# Patient Record
Sex: Female | Born: 1960 | Race: Black or African American | Hispanic: No | Marital: Married | Smoking: Never smoker
Health system: Southern US, Community
[De-identification: ages and names within clinical notes are randomized; demographics above are authoritative.]

## PROBLEM LIST (undated history)

## (undated) DIAGNOSIS — J45909 Unspecified asthma, uncomplicated: Secondary | ICD-10-CM

## (undated) DIAGNOSIS — I10 Essential (primary) hypertension: Secondary | ICD-10-CM

## (undated) HISTORY — PX: BACK SURGERY: SHX140

## (undated) HISTORY — PX: ABDOMINAL HYSTERECTOMY: SHX81

---

## 2007-10-07 ENCOUNTER — Inpatient Hospital Stay (HOSPITAL_COMMUNITY): Admission: RE | Admit: 2007-10-07 | Discharge: 2007-10-09 | Payer: Self-pay | Admitting: Neurosurgery

## 2007-11-15 ENCOUNTER — Encounter: Admission: RE | Admit: 2007-11-15 | Discharge: 2007-11-15 | Payer: Self-pay | Admitting: Neurosurgery

## 2007-12-27 ENCOUNTER — Encounter: Admission: RE | Admit: 2007-12-27 | Discharge: 2007-12-27 | Payer: Self-pay | Admitting: Neurosurgery

## 2008-03-15 ENCOUNTER — Encounter: Admission: RE | Admit: 2008-03-15 | Discharge: 2008-03-15 | Payer: Self-pay | Admitting: Neurosurgery

## 2009-12-12 ENCOUNTER — Encounter: Admission: RE | Admit: 2009-12-12 | Discharge: 2009-12-12 | Payer: Self-pay | Admitting: Neurosurgery

## 2010-09-30 NOTE — Op Note (Signed)
NAME:  Vicki Berry, Vicki Berry                ACCOUNT NO.:  [id number]   MEDICAL RECORD NO.:  [id number]          PATIENT TYPE:  INP   LOCATION:  3014                         FACILITY:  [facility]   PHYSICIAN:  Donalee Citrin, M.D.        DATE OF BIRTH:  Jul 21, 1960   DATE OF PROCEDURE:  10/07/2007  DATE OF DISCHARGE:                               OPERATIVE REPORT   PREOPERATIVE DIAGNOSIS:   PROCEDURE:  1. Decompressive laminectomies at L4-L5, L5-S1 with standard interbody      fusion.  2. Posterior lumbar interbody fusion at L4-L5, L5-S1 using a hybrid 12      x 22 PEEK Telamon cage packed with local autograft mixed with      Progenix bone substitute at L4-L5 with a tangent allograft wedge on      the other side and a 10 x 22 mm Telamon PEEK cage at L5-S1 packed      with local autograft with Progenix bone substitute and a tangent 10      x 26 mm on the other side.  3. Pedicle screw fixation L4-S1 using a 6.35 Legacy pedicle screw      system.  4. Posterolateral arthrodesis L4-S1 using local autograft packed with      Progenix bone substitute.  5. Placement of a medium Hemovac drain.   SURGEON:  Donalee Citrin, MD   ASSISTANT:  Reinaldo Meeker, MD   ANESTHESIA:  General endotracheal.   HISTORY OF PRESENT ILLNESS:  The patient is a very pleasant 46-year  female with progressive worsening back and bilateral leg pain worse in  the right radiating down to the top of foot and big toe consistent with  an L5 nerve root pattern.  Her MRI scan showed marked spondylosis with  severe spinal stenosis at L4-L5 and L5-S1 with hyperlordosis, facet  diastasis fluid within the facet complexes, and severe lateral recess  stenosis bilaterally as well as grade 1 spondylolisthesis at L4-L5.  The  patient failed all forms of conservative treatment and was recommended  decompressive stabilization.  Procedure risks and benefits of the  operation were explained to the patient.  She understood and agreed to  proceed  forward.   PROCEDURE IN DETAIL:  The patient was brought to the OR, was induced  under general anesthesia, and positioned prone on Wilson frame.  Back  was prepped and draped in the usual sterile fashion.  Fluoroscopy was  localized at the appropriate level.  After infiltrating 10 mL of  lidocaine with epinephrine, we had a midline incision made and Bovie  electrocautery was used to takedown the subperiosteum.  The dissection  was carried out to the lamina of L4, L5, and S1 bilaterally exposing the  TPs at all those levels.  Intraoperative x-ray confirmed localization of  the appropriate level marking the L4 pedicle and L4 TP.  Then the  spinous processes at L4-L5 were removed in their entirety.  Complete  central decompression was performed.  There was remarked facet  arthropathy and severe compression predominantly on the right at the L5  nerve root due  to the facet complex at L4-L5 immediately overgrowing the  L5 root.  This was teased away and removed in piecemeal fashion.  Complete medial facetectomies were performed at both levels.  Her lamina  and facet joints were noted to be markedly dystrophic and sclerotic with  severe compression of all neural foramen on that side.  So, we had  aggressive foraminotomies done at the L4, L5, and S1 nerve roots  bilaterally with complete medial facetectomies at L4-L5 and L5-S1.  This  decompressed the neural foramen and exposed the disk spaces.  After this  was achieved, attention was taken to the pedicle screw placement.  First  using a high-speed drill on the L4 on the right pedicle, a palatal was  drilled using fluoroscopy for trajectory and then using bony landmarks  both within the canal and external canal.  This pedicle was placed.  It  was cannulated with the awl, probed, tapped with a 5 pounds tap, probed  again, and 6 x 45 screw inserted at L4 on the right.  A 6 x 45 was  inserted at L5 on the right and 6 x 35 at S1.  This procedure  was  repeated on the left side with same sized screws.  All screws had  excellent purchase.  Fluoroscopy confirmed good trajectory in each screw  on the way and all screws were probed from within the probe as well as  within the pedicle as well as within the canal confirming no  mediolateral breach.  After all 6 screws had been placed, attention was  taken to interbody work.  The endplates were scraped and peri-disk  spaces were cleaned out.  A size 10 distractor was distally placed at L4-  L5 and this was felt not to be big enough, so a size 12 distractor was  inserted.  This had good apposition to the endplates and so a size 12  was felt to be appropriate sizing for graft material.  Using size 12  Cohan scissors, the right side of the interspace at L4-L5 was cleaned  out and this also helped reduce the deformity and decompressed the  forward.  A Telamon PEEK cage packed with local autograft and mixed with  Progenix was then placed on the right at L4-L5.  Distractor was removed  in a similar fashion on the left side.  This was cleaned out.  Local  autograft was packed centrally and a tangent allograft was inserted on  the left side at L4-L5.  Fluoroscopy confirmed good position of the  interbody spacers at L5-S1.  In a similar fashion, interbody spaces were  placed again with local autograft in the center with PEEK on the  patient's left side and the tangent on the right.  After all interbody  work had been completed, the wound was copiously irrigated and  meticulous hemostasis was maintained.  The neural foramina were re-  explored and noted to be widely patent.  Aggressive decortication of TPs  and lateral gutters, the local autograft was packed in the lateral  gutters along the TPs from L4-S1.  Then 6-mm rods were of appropriate  size.  There were inserted and tangentially done at S1.  The L5 process  was compressed against S1, the L4 compressed against L5.  Then Gelfoam  was laid on  top of the dura.  Postop fluoroscopy confirmed good position  of the screws, rods, and bone grafts.  Then the wound was closed after  immediate placement of medium  Hemovac drain in layers with interrupted  Vicryl in the muscle, subcutaneous tissue, and fascia, and then a  running 4-0 subcuticular to close the skin.  Benzoin and Steri-Strips  were applied.  The patient went to recovery room in stable condition.  At the end of the case, all instrument and sponge count was correct.           ______________________________  Donalee Citrin, M.D.     GC/MEDQ  D:  10/07/2007  T:  10/08/2007  Job:  [id number]

## 2010-09-30 NOTE — Discharge Summary (Signed)
NAME:  Vicki Berry, Vicki Berry                ACCOUNT NO.:  [id number]   MEDICAL RECORD NO.:  [id number]          PATIENT TYPE:  INP   LOCATION:  3014                         FACILITY:  [facility]   PHYSICIAN:  Donalee Citrin, M.D.        DATE OF BIRTH:  11/13/1960   DATE OF ADMISSION:  10/07/2007  DATE OF DISCHARGE:  10/09/2007                               DISCHARGE SUMMARY   ADMITTING DIAGNOSES:  1. Degenerative disease.  2. Lumbar spinal stenosis L4-L5 and L5-S1.   PROCEDURES:  1. Decompressive laminectomy L4-L5 and L5-S1.  2. Posterior lumbar fusion L4-L5 and L5-S1.   DISCHARGE DIAGNOSES:  1. Degenerative disease.  2. Lumbar spinal stenosis L4-L5 and L5-S1.   HOSPITAL COURSE:  The patient was brought in as an EMA, went to the  operating room, underwent the aforementioned procedure.  Postop, patient  did very well and recovered.  While on the floor, the patient was  completely resolved of leg pain and back stiffness.  She progressed with  physical therapy over the next 24-48 hours and by day 3 in the hospital,  she was stable to be discharged home with scheduled followup in 2 weeks.  At the time of discharge, she was ambulating, voiding spontaneously, and  pain was well controlled on pills.           ______________________________  Donalee Citrin, M.D.     GC/MEDQ  D:  11/02/2007  T:  11/03/2007  Job:  [id number]

## 2011-02-11 LAB — CBC
HCT: 43.7
Hemoglobin: 14.6
MCV: 86.9
Platelets: ADEQUATE
RBC: 5.03
RDW: 13.5

## 2011-02-11 LAB — BASIC METABOLIC PANEL
CO2: 21
Calcium: 9.1
Chloride: 106
GFR calc Af Amer: >60
Glucose, Bld: 92
Potassium: 4.1
Sodium: 137

## 2011-02-11 LAB — TYPE AND SCREEN: Antibody Screen: NEGATIVE

## 2016-10-12 ENCOUNTER — Encounter (HOSPITAL_BASED_OUTPATIENT_CLINIC_OR_DEPARTMENT_OTHER): Payer: Self-pay

## 2016-10-12 ENCOUNTER — Emergency Department (HOSPITAL_BASED_OUTPATIENT_CLINIC_OR_DEPARTMENT_OTHER)
Admission: EM | Admit: 2016-10-12 | Discharge: 2016-10-12 | Disposition: A | Discharge status: Home / Self Care | Payer: BLUE CROSS/BLUE SHIELD | Attending: Emergency Medicine | Admitting: Emergency Medicine

## 2016-10-12 DIAGNOSIS — M7989 Other specified soft tissue disorders: Secondary | ICD-10-CM | POA: Diagnosis present

## 2016-10-12 DIAGNOSIS — R600 Localized edema: Secondary | ICD-10-CM | POA: Diagnosis not present

## 2016-10-12 DIAGNOSIS — M791 Myalgia: Secondary | ICD-10-CM | POA: Insufficient documentation

## 2016-10-12 DIAGNOSIS — R2243 Localized swelling, mass and lump, lower limb, bilateral: Secondary | ICD-10-CM

## 2016-10-12 LAB — CBC WITH DIFFERENTIAL/PLATELET
BASOS ABS: 0 10*3/uL (ref 0.0–0.1)
BASOS PCT: 0 %
EOS ABS: 0.1 10*3/uL (ref 0.0–0.7)
Eosinophils Relative: 1 %
HEMATOCRIT: 39.2 % (ref 36.0–46.0)
Hemoglobin: 13 g/dL (ref 12.0–15.0)
Lymphocytes Relative: 25 %
Lymphs Abs: 3 10*3/uL (ref 0.7–4.0)
MCH: 28.9 pg (ref 26.0–34.0)
MCHC: 33.2 g/dL (ref 30.0–36.0)
MCV: 87.1 fL (ref 78.0–100.0)
MONO ABS: 0.8 10*3/uL (ref 0.1–1.0)
Monocytes Relative: 7 %
NEUTROS ABS: 8.1 10*3/uL — AB (ref 1.7–7.7)
NEUTROS PCT: 67 %
Platelets: 175 10*3/uL (ref 150–400)
RBC: 4.5 MIL/uL (ref 3.87–5.11)
RDW: 13.6 % (ref 11.5–15.5)
WBC: 12.1 10*3/uL — ABNORMAL HIGH (ref 4.0–10.5)

## 2016-10-12 LAB — BASIC METABOLIC PANEL
ANION GAP: 6 (ref 5–15)
BUN: 15 mg/dL (ref 6–20)
CALCIUM: 8.6 mg/dL — AB (ref 8.9–10.3)
CO2: 28 mmol/L (ref 22–32)
CREATININE: 1.04 mg/dL — AB (ref 0.44–1.00)
Chloride: 106 mmol/L (ref 101–111)
Glucose, Bld: 88 mg/dL (ref 65–99)
Potassium: 3.6 mmol/L (ref 3.5–5.1)
Sodium: 140 mmol/L (ref 135–145)

## 2016-10-12 LAB — D-DIMER, QUANTITATIVE (NOT AT ARMC): D DIMER QUANT: 0.44 ug{FEU}/mL (ref 0.00–0.50)

## 2016-10-12 MED ORDER — FUROSEMIDE 10 MG/ML IJ SOLN
20.0000 mg | Freq: Once | INTRAMUSCULAR | Status: AC
  Administered 2016-10-12: 20 mg via INTRAVENOUS
  Filled 2016-10-12: qty 2

## 2016-10-12 NOTE — Discharge Instructions (Signed)
Your creatinine is mildly elevated and needs to be rechecked by your primary care provider

## 2016-10-12 NOTE — ED Triage Notes (Signed)
C/o swelling to both feet/ankles x 4 days-NAD-steady gait

## 2016-10-12 NOTE — ED Provider Notes (Signed)
[facility] Note   CSN: [id number] Arrival date & time: 10/12/16  2049  By signing my name below, I, Linna DarnerRussell Turner, attest that this documentation has been prepared under the direction and in the presence of physician practitioner, Rolan BuccoBelfi, Mirakle Tomlin, MD. Electronically Signed: Linna Darnerussell Turner, Scribe. 10/12/2016. 9:59 PM.  History   Chief Complaint Chief Complaint  Patient presents with  . Edema   The history is provided by the patient. No language interpreter was used.    HPI Comments: Vicki Berry is a 56 y.o. female who presents to the Emergency Department complaining of persistent lower leg swelling beginning three days ago. She states her right lower leg began swelling yesterday. Patient reports her left lower leg is painful and notes her right lower leg is not. She experiences very mild bilateral lower leg swelling intermittently but notes her current leg swelling is the most severe it has ever been. No alleviating factors noted. Patient [place] and the return trip was yesterday. No h/o DVT or PE. No h/o cardiac problems. She has been urinating normally. She denies fevers, chills, chest pain, dyspnea, or any other associated symptoms.  History reviewed. No pertinent past medical history.  There are no active problems to display for this patient.   Past Surgical History:  Procedure Laterality Date  . ABDOMINAL HYSTERECTOMY    . BACK SURGERY      OB History    No data available       Home Medications    Prior to Admission medications   Medication Sig Start Date End Date Taking? Authorizing Provider  naproxen sodium (ANAPROX) 220 MG tablet Take 220 mg by mouth 2 (two) times daily with a meal.   Yes [provider]    Family History No family history on file.  Social History Social History  Substance Use Topics  . Smoking status: Never Smoker  . Smokeless tobacco: Never Used  . Alcohol use  No     Allergies   Biaxin [clarithromycin]   Review of Systems Review of Systems  Constitutional: Negative for chills, diaphoresis, fatigue and fever.  HENT: Negative for congestion, rhinorrhea and sneezing.   Eyes: Negative.   Respiratory: Negative for cough, chest tightness and shortness of breath.   Cardiovascular: Positive for leg swelling. Negative for chest pain.  Gastrointestinal: Negative for abdominal pain, blood in stool, diarrhea, nausea and vomiting.  Genitourinary: Negative for difficulty urinating, dysuria, flank pain, frequency and hematuria.  Musculoskeletal: Positive for myalgias. Negative for arthralgias and back pain.  Skin: Negative for rash.  Neurological: Negative for dizziness, speech difficulty, weakness, numbness and headaches.   Physical Exam Updated Vital Signs BP 129/75   Pulse 65   Temp 98.4 F (36.9 C) (Oral)   Resp (!) 22   Ht 5\' 6"  (1.676 m)   Wt 104.9 kg (231 lb 4 oz)   SpO2 97%   BMI 37.32 kg/m   Physical Exam  Constitutional: She is oriented to person, place, and time. She appears well-developed and well-nourished.  HENT:  Head: Normocephalic and atraumatic.  Eyes: Pupils are equal, round, and reactive to light.  Neck: Normal range of motion. Neck supple.  Cardiovascular: Normal rate, regular rhythm and normal heart sounds.   Pulses:      Dorsalis pedis pulses are 2+ on the right side, and 2+ on the left side.  Pulmonary/Chest: Effort normal and breath sounds normal. No respiratory distress. She has  no wheezes. She has no rales. She exhibits no tenderness.  Abdominal: Soft. Bowel sounds are normal. There is no tenderness. There is no rebound and no guarding.  Musculoskeletal: Normal range of motion. She exhibits edema.  1+ pitting edema to the right lower extremity and 2+ pitting edema to the left lower extremity. No warmth or erythema.  Lymphadenopathy:    She has no cervical adenopathy.  Neurological: She is alert and oriented to  person, place, and time.  Skin: Skin is warm and dry. No rash noted.  Psychiatric: She has a normal mood and affect.   ED Treatments / Results  Labs (all labs ordered are listed, but only abnormal results are displayed) Labs Reviewed  BASIC METABOLIC PANEL - Abnormal; Notable for the following:       Result Value   Creatinine, Ser 1.04 (*)    Calcium 8.6 (*)    All other components within normal limits  CBC WITH DIFFERENTIAL/PLATELET - Abnormal; Notable for the following:    WBC 12.1 (*)    Neutro Abs 8.1 (*)    All other components within normal limits  D-DIMER, QUANTITATIVE (NOT AT [facility])    EKG  EKG Interpretation  Date/Time:  Monday Oct 12 2016 21:52:19 EDT Ventricular Rate:  69 PR Interval:    QRS Duration: 97 QT Interval:  410 QTC Calculation: 440 R Axis:   75 Text Interpretation:  Sinus rhythm since last tracing no significant change Confirmed by Rolan Bucco [phone]) on 10/12/2016 11:12:10 PM       Radiology No results found.  Procedures Procedures (including critical care time)  DIAGNOSTIC STUDIES: Oxygen Saturation is 100% on RA, normal by my interpretation.    COORDINATION OF CARE: 9:48 PM Discussed treatment plan with pt at bedside and pt agreed to plan.  Medications Ordered in ED Medications  furosemide (LASIX) injection 20 mg (20 mg Intravenous Given 10/12/16 2234)     Initial Impression / Assessment and Plan / ED Course  I have reviewed the triage vital signs and the nursing notes.  Pertinent labs & imaging results that were available during my care of the patient were reviewed by me and considered in my medical decision making (see chart for details).     Patient presents with bilateral lower leg edema, left greater than right. She has no apparent lung involvement. No cardiac symptoms. Her labs are non-concerning other than her creatinine is mildly elevated. There are no signs of cellulitis. Her d-dimer is negative I will bring her back  tomorrow to do Doppler ultrasounds to rule out DVT. She was given dose of Lasix in the ED. She was encouraged to keep her legs elevated. She was encouraged to watch her salt intake. She was encouraged to have close follow-up with her PCP. I did advise her that she'll need to have her creatinine rechecked by her PCP. Return precautions were given.  Final Clinical Impressions(s) / ED Diagnoses   Final diagnoses:  Pedal edema    New Prescriptions New Prescriptions   No medications on file   I personally performed the services described in this documentation, which was scribed in my presence.  The recorded information has been reviewed and considered.    Rolan Bucco, MD 10/12/16 [phone]

## 2016-10-12 NOTE — ED Notes (Signed)
Radiology at bedside to schedule US.

## 2016-10-13 ENCOUNTER — Ambulatory Visit (HOSPITAL_BASED_OUTPATIENT_CLINIC_OR_DEPARTMENT_OTHER)
Admit: 2016-10-13 | Discharge: 2016-10-13 | Disposition: A | Discharge status: Home / Self Care | Payer: BLUE CROSS/BLUE SHIELD | Attending: Emergency Medicine | Admitting: Emergency Medicine

## 2016-10-13 DIAGNOSIS — R2243 Localized swelling, mass and lump, lower limb, bilateral: Secondary | ICD-10-CM | POA: Insufficient documentation

## 2016-12-22 ENCOUNTER — Other Ambulatory Visit (HOSPITAL_BASED_OUTPATIENT_CLINIC_OR_DEPARTMENT_OTHER): Payer: Self-pay | Admitting: Neurosurgery

## 2016-12-22 DIAGNOSIS — M48062 Spinal stenosis, lumbar region with neurogenic claudication: Secondary | ICD-10-CM

## 2016-12-26 ENCOUNTER — Ambulatory Visit (HOSPITAL_BASED_OUTPATIENT_CLINIC_OR_DEPARTMENT_OTHER)
Admission: RE | Admit: 2016-12-26 | Discharge: 2016-12-26 | Disposition: A | Discharge status: Home / Self Care | Payer: BLUE CROSS/BLUE SHIELD | Source: Ambulatory Visit | Attending: Neurosurgery | Admitting: Neurosurgery

## 2016-12-26 DIAGNOSIS — M48062 Spinal stenosis, lumbar region with neurogenic claudication: Secondary | ICD-10-CM | POA: Insufficient documentation

## 2016-12-26 DIAGNOSIS — M5126 Other intervertebral disc displacement, lumbar region: Secondary | ICD-10-CM | POA: Diagnosis not present

## 2016-12-26 MED ORDER — GADOBENATE DIMEGLUMINE 529 MG/ML IV SOLN
20.0000 mL | Freq: Once | INTRAVENOUS | Status: AC | PRN
  Administered 2016-12-26: 20 mL via INTRAVENOUS

## 2019-01-10 IMAGING — MR MR LUMBAR SPINE WO/W CM
10 of 11 series · 43 of 48 positions shown · IV contrast (20 ML MULTIHANCE)
Comparison: MRI of the lumbar spine 08/31/2009 at [REDACTED].

CLINICAL DATA: Lumbar stenosis neurogenic location. Low back pain
for 4 months. Left leg pain. Numbness in left lower extremity.
Previous lumbar spine fusion 4447.

EXAM:
MRI LUMBAR SPINE WITHOUT AND WITH CONTRAST
TECHNIQUE: Multiplanar and multiecho pulse sequences of the lumbar spine were
obtained without and with intravenous contrast.
CONTRAST:  20mL MULTIHANCE GADOBENATE DIMEGLUMINE 529 MG/ML IV SOLN

[Series 3: T1 · sagittal · 4.0mm · 0.81mm/px · 3 of 16 slices shown (1 of 6)]
[im 1/16]
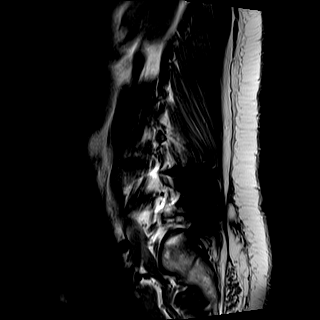
[im 8/16]
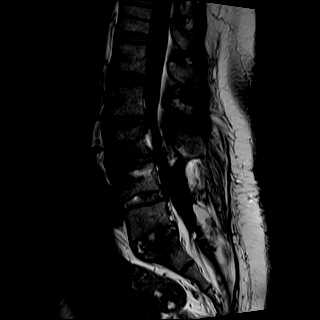
[im 16/16]
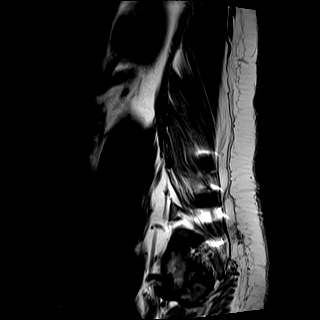

[Series 5: T2 · axial · 4.0mm · 0.62mm/px · z∈[+40,+135]mm · 5 of 20 slices shown (1 of 3)]
[im 1/20]
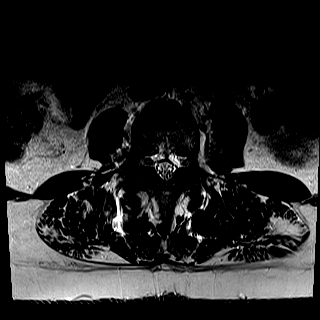
[im 5/20]
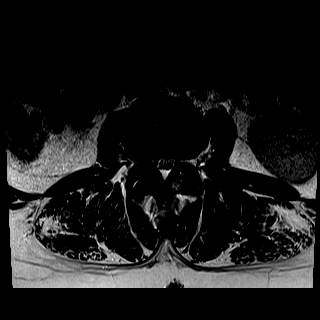
[im 10/20]
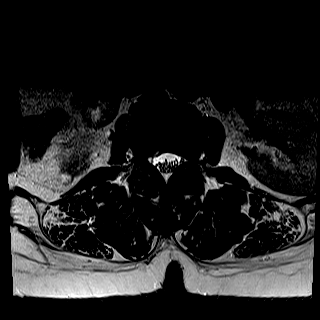
[im 15/20]
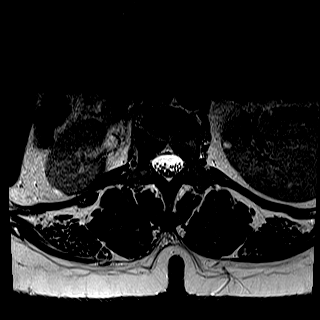
[im 20/20]
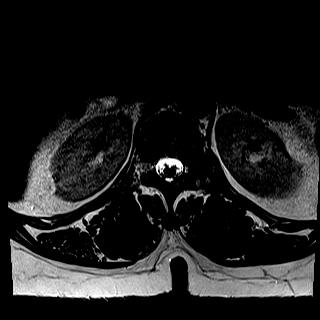

[Series 6: T2 · axial · 4.0mm · 0.62mm/px · z∈[-84,+17]mm · 5 of 22 slices shown (2 of 3)]
[im 1/22]
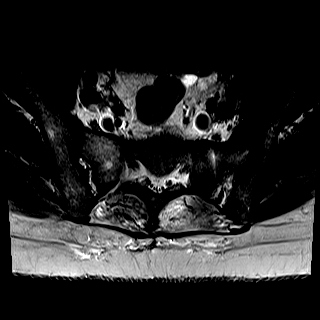
[im 6/22]
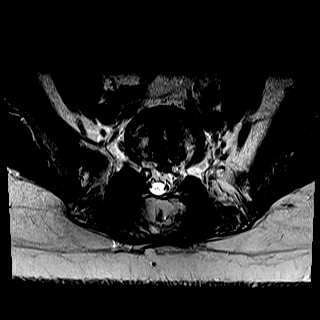
[im 11/22]
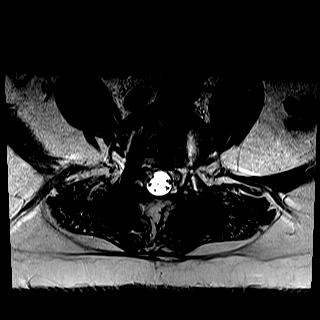
[im 16/22]
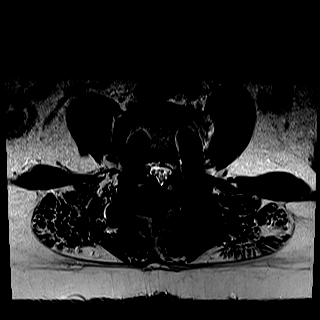
[im 22/22]
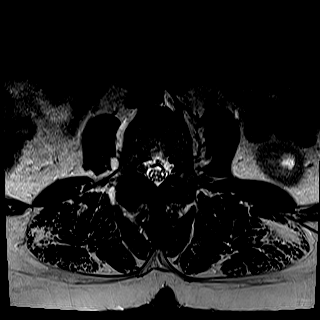

[Series 7: T1 · axial · 4.0mm · 0.62mm/px · z∈[+40,+135]mm · 5 of 20 slices shown (2 of 6)]
[im 1/20]
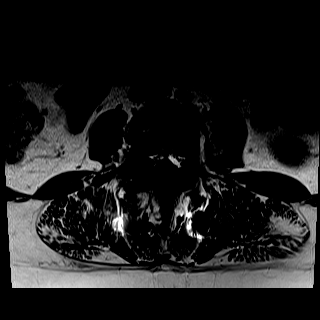
[im 5/20]
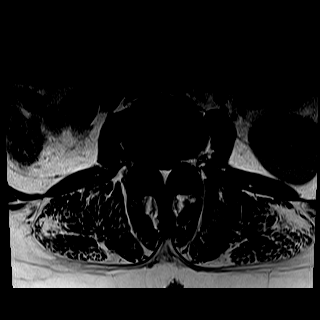
[im 10/20]
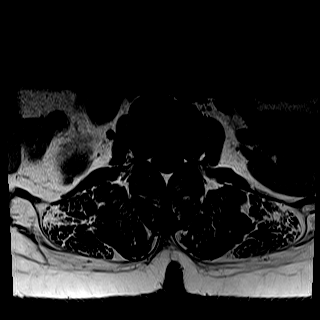
[im 15/20]
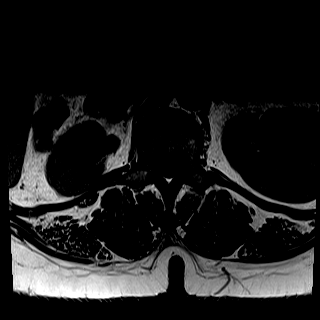
[im 20/20]
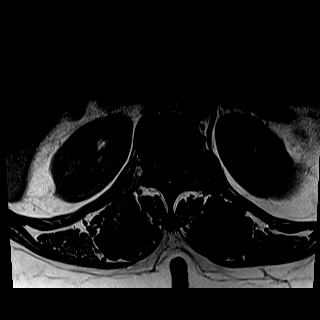

[Series 8: T1 · axial · 4.0mm · 0.62mm/px · z∈[-84,+17]mm · 5 of 22 slices shown (3 of 6)]
[im 1/22]
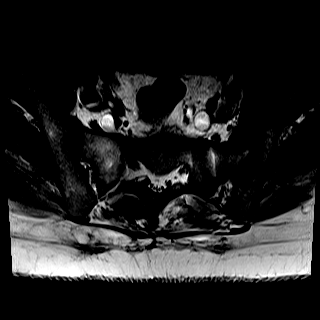
[im 6/22]
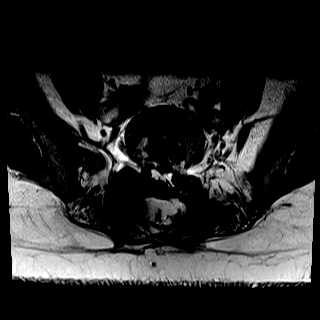
[im 11/22]
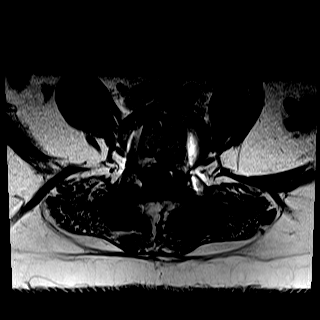
[im 16/22]
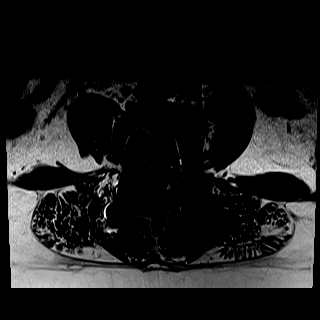
[im 22/22]
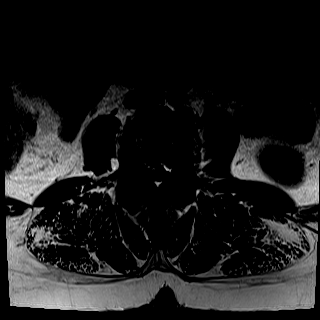

[Series 9: T2 · sagittal · 4.0mm · 0.81mm/px · 4 of 16 slices shown (3 of 3)]
[im 1/16]
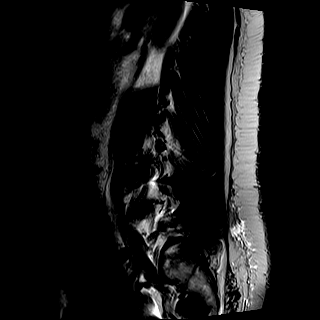
[im 6/16]
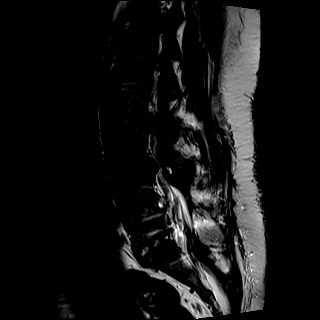
[im 11/16]
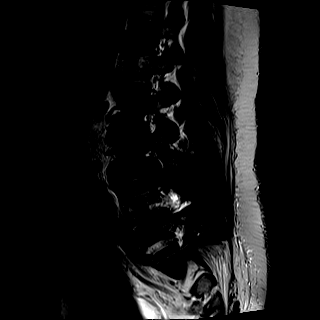
[im 16/16]
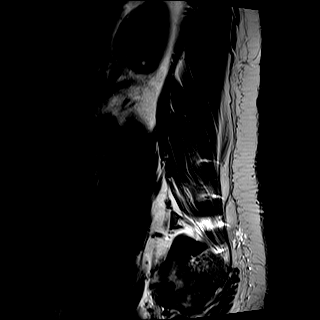

[Series 10: T1 fat-sat · sagittal · 4.0mm · 0.81mm/px · 4 of 16 slices shown]
[im 1/16]
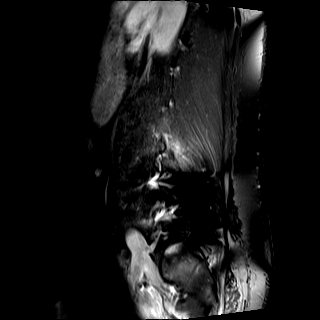
[im 6/16]
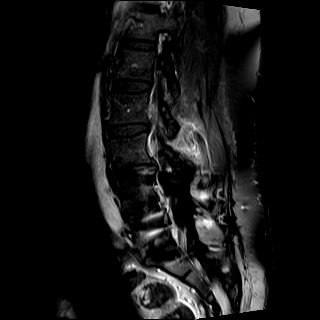
[im 11/16]
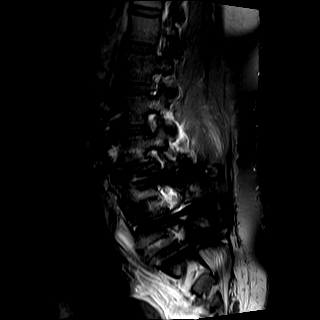
[im 16/16]
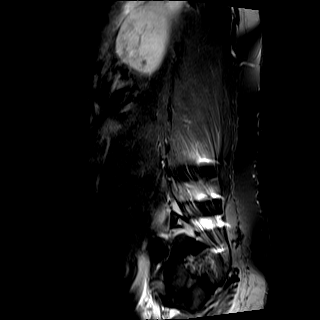

[Series 11: T1 · axial · 4.0mm · 0.62mm/px · z∈[+35,+129]mm · 5 of 20 slices shown (4 of 6)]
[im 1/20]
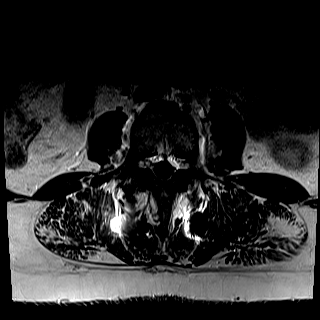
[im 5/20]
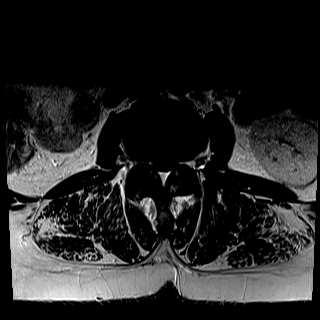
[im 10/20]
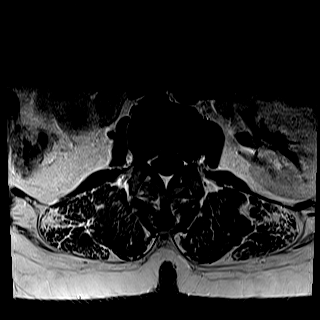
[im 15/20]
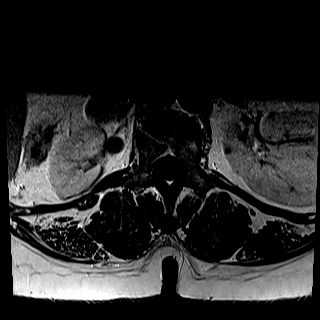
[im 20/20]
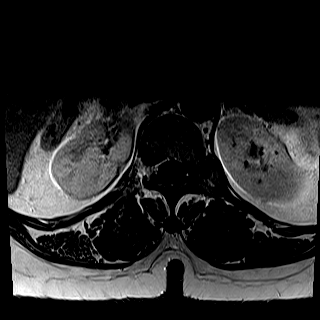

[Series 12: T1 · axial · 4.0mm · 0.62mm/px · z∈[-87,+14]mm · 5 of 22 slices shown (5 of 6)]
[im 1/22]
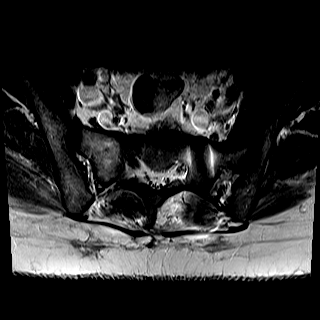
[im 6/22]
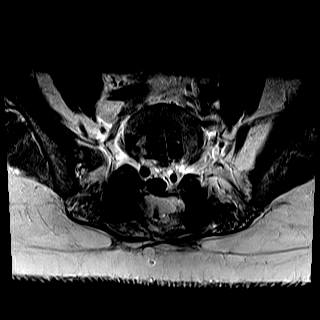
[im 11/22]
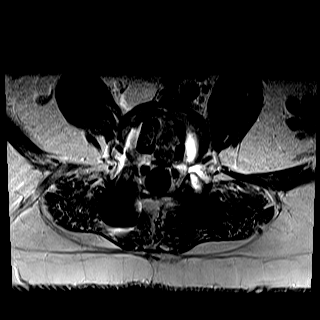
[im 16/22]
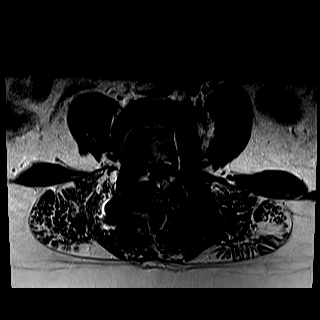
[im 22/22]
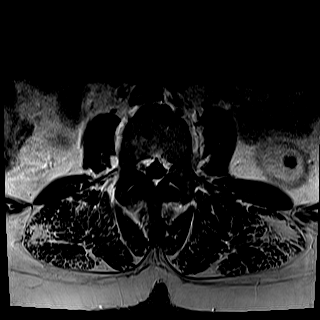

[Series 13: T1 · sagittal · 4.0mm · 0.81mm/px · 2 of 16 slices shown (6 of 6)]
[im 1/16]
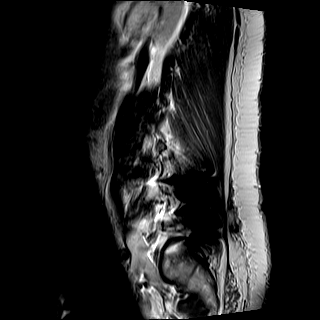
[im 6/16]
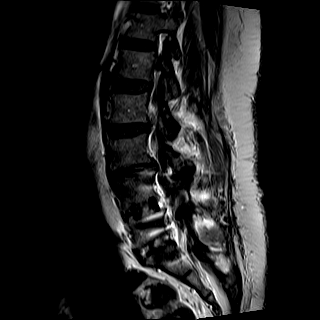

[43 of 48 positions shown; findings below may reference images not displayed]

FINDINGS: Segmentation: 5 non rib-bearing lumbar type vertebral bodies are
present.

Alignment: Slight anterolisthesis is present at L3-4, new since the
prior exam. AP alignment is otherwise anatomic.

Vertebrae: Progressive endplate marrow changes are present at L3-4.
Marrow signal and vertebral body heights are otherwise normal.

Conus medullaris: Extends to the T12-L1 level and appears normal.

Paraspinal and other soft tissues: A 7 mm cyst is noted at the lower
pole of the right kidney. A 9 mm cyst is present at the lower pole
of the left kidney.

Disc levels:

The disc levels at T12-L1 and L1-2 are normal.

L2-3: A right paramedian superior disc protrusion or what appears to
be a free fragment extends within the right lateral recess 1.8 cm
above the inferior endplate of L2. This results in severe right
lateral recess and subarticular stenosis. Disc material extends into
the right neural foramen as well. Moderate right and mild left
foraminal narrowing is present. Asymmetric right-sided facet
hypertrophy is present.

L3-4: There is uncovering of a broad-based disc protrusion with
significant progression of now moderate central canal stenosis with
subarticular narrowing bilaterally. Advanced bilateral facet
hypertrophy has progressed. Moderate foraminal narrowing is evident
bilaterally.

L4-5: Fusion and laminectomy is present. There is no residual
recurrent stenosis.

L5-S1: Fusion and laminectomy is noted. There is no residual
recurrent stenosis.
IMPRESSION: 1. Progressive adjacent level disease at L3-4 with moderate central
and bilateral foraminal stenosis. There is progressive disc
protrusion and advanced facet hypertrophy.
2. Superior disc extrusion or free fragment extending from the L2-3
disc space superiorly resulting in severe right lateral recess and
subarticular stenosis.
3. Moderate right and mild left foraminal stenosis at L2-3 has
progressed.
4. PLIF L4-5 and L5-S1 without residual recurrent stenosis at these
levels.

## 2020-10-17 ENCOUNTER — Other Ambulatory Visit: Payer: Self-pay

## 2020-10-17 ENCOUNTER — Emergency Department (HOSPITAL_BASED_OUTPATIENT_CLINIC_OR_DEPARTMENT_OTHER)
Admission: EM | Admit: 2020-10-17 | Discharge: 2020-10-17 | Disposition: A | Discharge status: Home / Self Care | Payer: BC Managed Care – PPO | Attending: Emergency Medicine | Admitting: Emergency Medicine

## 2020-10-17 ENCOUNTER — Encounter (HOSPITAL_BASED_OUTPATIENT_CLINIC_OR_DEPARTMENT_OTHER): Payer: Self-pay

## 2020-10-17 DIAGNOSIS — J45909 Unspecified asthma, uncomplicated: Secondary | ICD-10-CM | POA: Diagnosis not present

## 2020-10-17 DIAGNOSIS — I10 Essential (primary) hypertension: Secondary | ICD-10-CM | POA: Insufficient documentation

## 2020-10-17 DIAGNOSIS — J019 Acute sinusitis, unspecified: Secondary | ICD-10-CM | POA: Insufficient documentation

## 2020-10-17 DIAGNOSIS — R0981 Nasal congestion: Secondary | ICD-10-CM | POA: Diagnosis present

## 2020-10-17 HISTORY — DX: Essential (primary) hypertension: I10

## 2020-10-17 HISTORY — DX: Unspecified asthma, uncomplicated: J45.909

## 2020-10-17 MED ORDER — NETI POT SINUS WASH 2300-700 MG NA KIT: PACK | NASAL | 0 refills | Status: AC

## 2020-10-17 NOTE — Discharge Instructions (Addendum)
Vicki Berry, it was a pleasure taking care of you. I believe you have sinusitis, which could be due to allergens or to an infection. At this time, I think that a bacterial infection is unlikely.  Here are your instructions: START Neti pot for sinus washes, twice daily CONTINUE your Flonase, Allegra, and lozenges

## 2020-10-17 NOTE — ED Provider Notes (Signed)
[facility] Provider Note   CSN: [id number] Arrival date & time: 10/17/20  1109     History Chief Complaint  Patient presents with  . Sore Throat    Runny nose, headache    Vicki Berry is a 60 y.o. female with hx of HTN, asthma, allergic rhinitis who presents with complaint of congestion, sinus pressure, headache, sore throat for the past 2 days. States this happened after cleaning her attic and kicking up a lot of dust. Has continued cleaning since then, and also notes lots of dust exposure at work. 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 Nobody around her has similar symptoms. She has been using flonase, allegra, and albuterol inhaler. Denies fever, chills, cough, shortness of breath  Past Medical History:  Diagnosis Date  . Asthma   . Hypertension     There are no problems to display for this patient.   Past Surgical History:  Procedure Laterality Date  . ABDOMINAL HYSTERECTOMY    . BACK SURGERY       OB History   No obstetric history on file.     History reviewed. No pertinent family history.  Social History   Tobacco Use  . Smoking status: Never Smoker  . Smokeless tobacco: Never Used  Substance Use Topics  . Alcohol use: No  . Drug use: No    Home Medications Prior to Admission medications   Medication Sig Start Date End Date Taking? Authorizing Provider  albuterol (VENTOLIN HFA) 108 (90 Base) MCG/ACT inhaler Inhale into the lungs. 09/17/20  Yes [provider]  fexofenadine (ALLEGRA) 180 MG tablet Take by mouth. 09/17/20  Yes [provider]  Sodium Chloride-Sodium Bicarb (NETI POT SINUS [place]) 2300-700 MG KIT Flush sinuses twice daily 10/17/20  Yes Andrew Au, MD  naproxen sodium (ANAPROX) 220 MG tablet Take 220 mg by mouth 2 (two) times daily with a meal.    [provider]    Allergies    Biaxin [clarithromycin] and Metronidazole  Review of Systems   Review of Systems   Constitutional: Negative for chills and fever.  HENT: Positive for congestion, rhinorrhea, sinus pressure, sore throat and voice change. Negative for ear pain and hearing loss.   Respiratory: Negative for cough and shortness of breath.   Cardiovascular: Negative for chest pain and palpitations.  Gastrointestinal: Negative for abdominal pain, nausea and vomiting.  Genitourinary: Negative for dysuria and frequency.  All other systems reviewed and are negative.   Physical Exam Updated Vital Signs BP 133/75 (BP Location: Right Arm)   Pulse 74   Temp 98.3 F (36.8 C) (Oral)   Resp 18   Ht _0  (1.676 m)   Wt 95.7 kg   SpO2 99%   BMI 34.06 kg/m   Physical Exam Vitals and nursing note reviewed.  Constitutional:      Appearance: Normal appearance.  HENT:     Head: Normocephalic and atraumatic.     Right Ear: Tympanic membrane, ear canal and external ear normal.     Left Ear: Tympanic membrane, ear canal and external ear normal.     Nose: Nose normal. No congestion or rhinorrhea.     Mouth/Throat:     Mouth: Mucous membranes are moist.     Pharynx: Uvula midline. No oropharyngeal exudate, posterior oropharyngeal erythema or uvula swelling.     Tonsils: No tonsillar exudate.  Eyes:     Extraocular Movements: Extraocular movements intact.  Cardiovascular:  Rate and Rhythm: Normal rate and regular rhythm.     Heart sounds: Normal heart sounds. No murmur heard. No friction rub. No gallop.   Pulmonary:     Effort: Pulmonary effort is normal.     Breath sounds: No wheezing, rhonchi or rales.  Abdominal:     General: Abdomen is flat.  Musculoskeletal:        General: No swelling or deformity. Normal range of motion.     Cervical back: Normal range of motion and neck supple.  Skin:    General: Skin is warm and dry.  Neurological:     General: No focal deficit present.     Mental Status: She is alert and oriented to person, place, and time.  Psychiatric:        Mood and  Affect: Mood normal.        Behavior: Behavior normal.     ED Results / Procedures / Treatments   Labs (all labs ordered are listed, but only abnormal results are displayed) Labs Reviewed - No data to display  EKG None  Radiology No results found.  Procedures Procedures   Medications Ordered in ED Medications - No data to display  ED Course  I have reviewed the triage vital signs and the nursing notes.  Pertinent labs & imaging results that were available during my care of the patient were reviewed by me and considered in my medical decision making (see chart for details).    MDM Rules/Calculators/A&P                          Patient with sinus pressure, congestion, sore throat after dust exposure while cleaning her attic. She likely has sinusitis could be from allergen exposure or from viral illness. Low suspicion for bacterial infection with no fever, tachycardia, oropharyngeal exudates, and she generally appears well.   Will discharge with Neti pot for sinus flushes. She will continue using flonase, allegra, throat lozenges.  Final Clinical Impression(s) / ED Diagnoses Final diagnoses:  Acute non-recurrent sinusitis, unspecified location    Rx / DC Orders ED Discharge Orders         Ordered    Sodium Chloride-Sodium Bicarb (NETI POT SINUS [place]) 2300-700 MG KIT        10/17/20 1214           Andrew Au, MD 10/17/20 1239    Rex Kras, Wenda Overland, MD 10/19/20 [phone]

## 2020-10-17 NOTE — ED Triage Notes (Signed)
Pt states sorethroat, headche, runny nose, beginning Monday, has not been covid tested. Was cleaning attic, kicked up some dust.

## 2023-11-27 ENCOUNTER — Encounter (HOSPITAL_BASED_OUTPATIENT_CLINIC_OR_DEPARTMENT_OTHER): Payer: Self-pay

## 2023-11-27 ENCOUNTER — Other Ambulatory Visit: Payer: Self-pay

## 2023-11-27 ENCOUNTER — Emergency Department (HOSPITAL_BASED_OUTPATIENT_CLINIC_OR_DEPARTMENT_OTHER)
Admission: EM | Admit: 2023-11-27 | Discharge: 2023-11-27 | Disposition: A | Discharge status: Home / Self Care | Attending: Emergency Medicine | Admitting: Emergency Medicine

## 2023-11-27 DIAGNOSIS — H5789 Other specified disorders of eye and adnexa: Secondary | ICD-10-CM | POA: Insufficient documentation

## 2023-11-27 DIAGNOSIS — H5712 Ocular pain, left eye: Secondary | ICD-10-CM | POA: Insufficient documentation

## 2023-11-27 MED ORDER — FLUORESCEIN SODIUM 1 MG OP STRP
1.0000 | ORAL_STRIP | Freq: Once | OPHTHALMIC | Status: AC
  Administered 2023-11-27: 1 via OPHTHALMIC
  Filled 2023-11-27: qty 1

## 2023-11-27 MED ORDER — TETRACAINE HCL 0.5 % OP SOLN
2.0000 [drp] | Freq: Once | OPHTHALMIC | Status: AC
  Administered 2023-11-27: 2 [drp] via OPHTHALMIC
  Filled 2023-11-27: qty 4

## 2023-11-27 MED ORDER — POLYMYXIN B-TRIMETHOPRIM 10000-0.1 UNIT/ML-% OP SOLN
2.0000 [drp] | Freq: Three times a day (TID) | OPHTHALMIC | Status: DC
  Administered 2023-11-27: 2 [drp] via OPHTHALMIC
  Filled 2023-11-27: qty 10

## 2023-11-27 NOTE — ED Notes (Signed)
ED Provider at bedside for eye exam 

## 2023-11-27 NOTE — ED Provider Notes (Signed)
 [place] [facility] AT [facility] Provider Note   CSN: [id number] Arrival date & time: 11/27/23  9052     Patient presents with: Eye Pain   Vicki Berry is a 63 y.o. female.   Patient to the emergency department for evaluation of left eye redness, drainage, and some pain in the morning ongoing over the past 4 days.  She noticed some matting when she awoke initially.  She had some pain with light when she first open her eyes in the morning, but no constant pain.  She has had some cloudiness of her vision like a film is over my eye.  She does wear contact lenses.  She does not always take them out at night.  She denies pain with movement of her eye on my exam.  No fevers.  No significant swelling or pain around the eye.       Prior to Admission medications   Medication Sig Start Date End Date Taking? Authorizing Provider  albuterol (VENTOLIN HFA) 108 (90 Base) MCG/ACT inhaler Inhale into the lungs. 09/17/20   [provider]  fexofenadine (ALLEGRA) 180 MG tablet Take by mouth. 09/17/20   [provider]  naproxen sodium (ANAPROX) 220 MG tablet Take 220 mg by mouth 2 (two) times daily with a meal.    [provider]  Sodium Chloride-Sodium Bicarb (NETI POT SINUS [place]) 2300-700 MG KIT Flush sinuses twice daily 10/17/20   Laurence Fonda GRADE, MD    Allergies: Biaxin [clarithromycin], Dust mite extract, and Metronidazole    Review of Systems  Updated Vital Signs BP (!) 176/93 (BP Location: Left Arm)   Pulse 70   Temp 98.4 F (36.9 C) (Oral)   Resp 18   Ht 5' 6 (1.676 m)   Wt 97.1 kg   SpO2 98%   BMI 34.54 kg/m   Physical Exam Vitals and nursing note reviewed.  Constitutional:      Appearance: She is well-developed.  HENT:     Head: Normocephalic and atraumatic.  Eyes:     General: Lids are normal.        Right eye: No foreign body, discharge or hordeolum.        Left eye: No discharge or hordeolum.     Intraocular pressure:  Left eye pressure is 17 mmHg.     Conjunctiva/sclera:     Right eye: Right conjunctiva is not injected. No chemosis or exudate.    Left eye: Left conjunctiva is injected. No chemosis or exudate.    Pupils:     Right eye: Pupil is round, reactive and not sluggish.     Left eye: Pupil is round, reactive and not sluggish. Fluorescein  uptake present.      Comments: With visible light, there is a small hazy grayish area in the center of the cornea.  On fluorescein  exam there is a small punctate area of uptake at approximately 2:00, a couple of millimeters off of the visual axis.  Sclera injection noted.  Ciliary flush noted.  Pulmonary:     Effort: No respiratory distress.  Musculoskeletal:     Cervical back: Normal range of motion and neck supple.  Skin:    General: Skin is warm and dry.  Neurological:     Mental Status: She is alert.     (all labs ordered are listed, but only abnormal results are displayed) Labs Reviewed - No data to display  EKG: None  Radiology: No results found.   Procedures  Medications Ordered in the ED  trimethoprim -polymyxin b  (POLYTRIM ) ophthalmic solution 2 drop (2 drops Left Eye Given 11/27/23 1056)  fluorescein  ophthalmic strip 1 strip (1 strip Left Eye Given by Other 11/27/23 1056)  tetracaine  (PONTOCAINE) 0.5 % ophthalmic solution 2 drop (2 drops Left Eye Given by Other 11/27/23 1056)   ED Course  Patient seen and examined. History obtained directly from patient.   Labs/EKG: None ordered  Imaging: None ordered  Medications/Fluids: Ordered: Tetracaine , fluorescein , Polytrim   Most recent vital signs reviewed and are as follows: BP (!) 176/93 (BP Location: Left Arm)   Pulse 70   Temp 98.4 F (36.9 C) (Oral)   Resp 18   Ht 5' 6 (1.676 m)   Wt 97.1 kg   SpO2 98%   BMI 34.54 kg/m   Initial impression: Left eye pain and redness, and patient who wears contact lenses.  Slit-lamp exam showed small punctate area of uptake.  There is a small  hazy area in the cornea noted that is visible.  Unclear if this is a corneal infiltrate versus early ulcer.  I discussed case with on-call ophthalmologist Dr. Octavia.  He advises Polytrim  drops 3 times a day.  These will be provided to the patient here.  He asked that the patient call the office on Monday morning to schedule a follow-up appointment on Monday.  Advises no contact lens use.  I have passed these recommendations onto the patient.  Discussed importance of follow-up.  I have provided patient information to consultant.   11:04 AM Reassessment performed. Patient appears stable.  Most current vital signs reviewed and are as follows: BP (!) 176/93 (BP Location: Left Arm)   Pulse 70   Temp 98.4 F (36.9 C) (Oral)   Resp 18   Ht 5' 6 (1.676 m)   Wt 97.1 kg   SpO2 98%   BMI 34.54 kg/m   Plan: Discharge to home.   Prescriptions written for: None, patient provided with Polytrim  drops here  Other home care instructions discussed: No contact lens use until cleared  ED return instructions discussed: Return with worsening pain, fever, worsening swelling around the eye or vision loss  Follow-up instructions discussed: Patient encouraged to follow-up with ophthalmology in 2 days as planned.                                 Medical Decision Making Risk Prescription drug management.   Patient with inflamed left eye.  Small area of haziness of the cornea, cannot rule out early ulcer.  No foreign bodies noted. No surrounding erythema, swelling, vision changes/loss suspicious for orbital or periorbital cellulitis.  Doubt iritis given minimal light sensitivity. No signs of glaucoma, intraocular pressures normal. No symptoms of retinal detachment.  Patient will require close follow-up with ophthalmology, discussed with consultant here today.      Final diagnoses:  Pain of left eye    ED Discharge Orders     None          Desiderio Chew, DEVONNA 11/27/23 1106    Tonia Chew, MD 11/27/23 1344

## 2023-11-27 NOTE — Discharge Instructions (Signed)
 Please read and follow all provided instructions.  Your diagnoses today include:  1. Pain of left eye     Tests performed today include: Visual acuity testing to check your vision Fluorescein  dye examination to look for scratches on your eye Tonometry to evaluate the pressure in the front of the eye Vital signs. See below for your results today.   Medications prescribed:  Polytrim  (polymyxin B /trimethoprim ) - antibiotic eye drops  Use this medication as follows: Use 2 drops in affected eye every 8 hours.   Take any prescribed medications only as directed.  Home care instructions:  Follow any educational materials contained in this packet.  You have a small irregular area of the eye on the cornea (the clear part of the eye). This condition may be caused by trauma. It is a common problem for people who wear contact lenses. Proper treatment is important. No evidence of infection is noted today, but you could develop an infection called a corneal ulcer or have some retained foreign body that may or may not have been noted today in the Emergency Department. Ulcers are not only painful, but they may also scar the cornea and cause permanent damage to the eye.   If you wear contact lenses, do not use them until your eye caregiver approves. Follow-up care is necessary to be sure the corneal abrasion is healing if not completely resolved in 2-3 days. See your caregiver or eye specialist as suggested for followup.   Follow-up instructions: Please call Dr. Milford office on Monday to schedule a follow-up appointment.  It is important that you are seen to make sure that your condition is not worsening.  Return instructions:  Please return to the Emergency Department if you experience worsening symptoms.  Please return immediately if you develop severe pain, pus drainage, new change in vision, or fever. Please return if you have any other emergent concerns.  Additional Information:  Your vital  signs today were: BP (!) 176/93 (BP Location: Left Arm)   Pulse 70   Temp 98.4 F (36.9 C) (Oral)   Resp 18   Ht 5' 6 (1.676 m)   Wt 97.1 kg   SpO2 98%   BMI 34.54 kg/m  If your blood pressure (BP) was elevated above 135/85 this visit, please have this repeated by your doctor within one month.

## 2023-11-27 NOTE — ED Triage Notes (Signed)
 C/o left eye pain with movement/ drainage & redness since Tuesday. Using cipro eye drops without improvement since Tuesday. C/o blurred vision in eye.

## 2023-11-27 NOTE — ED Notes (Signed)
